# Patient Record
Sex: Female | Born: 1970 | Race: White | Hispanic: No | Marital: Married | Smoking: Never smoker
Health system: Southern US, Community
[De-identification: ages and names within clinical notes are randomized; demographics above are authoritative.]

## PROBLEM LIST (undated history)

## (undated) DIAGNOSIS — F419 Anxiety disorder, unspecified: Secondary | ICD-10-CM

---

## 2005-07-01 ENCOUNTER — Ambulatory Visit (HOSPITAL_COMMUNITY): Admission: RE | Admit: 2005-07-01 | Discharge: 2005-07-01 | Payer: Self-pay | Admitting: Obstetrics and Gynecology

## 2005-07-08 ENCOUNTER — Inpatient Hospital Stay (HOSPITAL_COMMUNITY): Admission: RE | Admit: 2005-07-08 | Discharge: 2005-07-09 | Payer: Self-pay | Admitting: Obstetrics and Gynecology

## 2005-07-08 ENCOUNTER — Encounter (INDEPENDENT_AMBULATORY_CARE_PROVIDER_SITE_OTHER): Payer: Self-pay | Admitting: *Deleted

## 2006-06-11 ENCOUNTER — Other Ambulatory Visit: Admission: RE | Admit: 2006-06-11 | Discharge: 2006-06-11 | Payer: Self-pay | Admitting: Gynecology

## 2007-07-31 ENCOUNTER — Other Ambulatory Visit: Admission: RE | Admit: 2007-07-31 | Discharge: 2007-07-31 | Payer: Self-pay | Admitting: *Deleted

## 2008-05-15 ENCOUNTER — Inpatient Hospital Stay (HOSPITAL_COMMUNITY): Admission: AD | Admit: 2008-05-15 | Discharge: 2008-05-28 | Payer: Self-pay | Admitting: Obstetrics

## 2008-05-25 ENCOUNTER — Encounter (INDEPENDENT_AMBULATORY_CARE_PROVIDER_SITE_OTHER): Payer: Self-pay | Admitting: Obstetrics

## 2008-05-30 ENCOUNTER — Encounter: Admission: RE | Admit: 2008-05-30 | Discharge: 2008-06-29 | Payer: Self-pay | Admitting: Obstetrics

## 2008-06-30 ENCOUNTER — Encounter: Admission: RE | Admit: 2008-06-30 | Discharge: 2008-07-26 | Payer: Self-pay | Admitting: Obstetrics

## 2009-11-09 IMAGING — US US OB COMP +14 WK
1 series · 14 of 28 positions shown · non-contrast
Comparison: none

OBSTETRICAL ULTRASOUND:
 This ultrasound exam was performed in the [HOSPITAL] Ultrasound Department.  The OB US report was generated in the AS system, and faxed to the ordering physician.  This report is also available in [REDACTED] PACS.

[Series 1: us ob comp +14 wk · 14 of 79 slices shown]
[im 3/79]
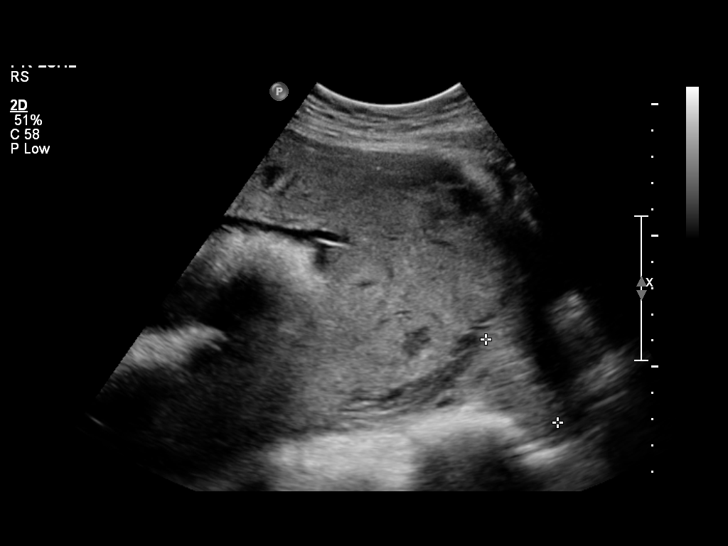
[im 9/79]
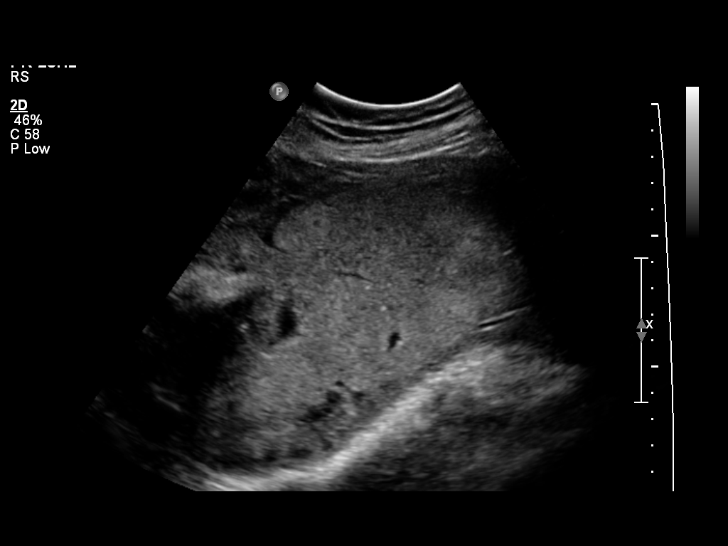
[im 15/79]
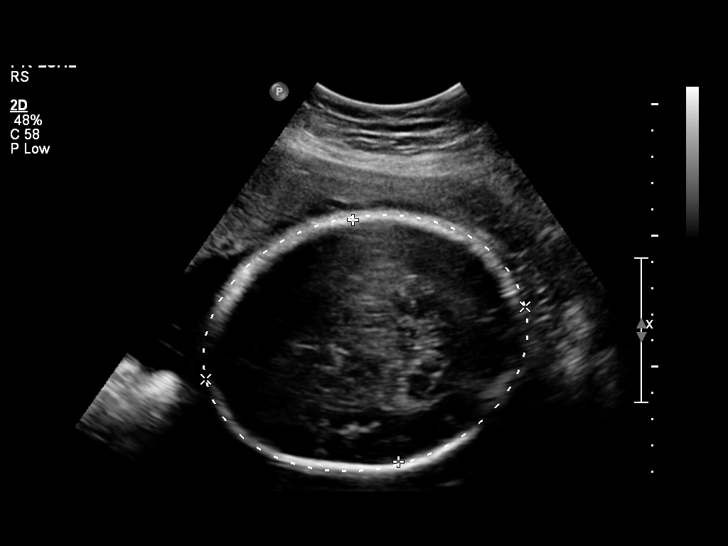
[im 21/79]
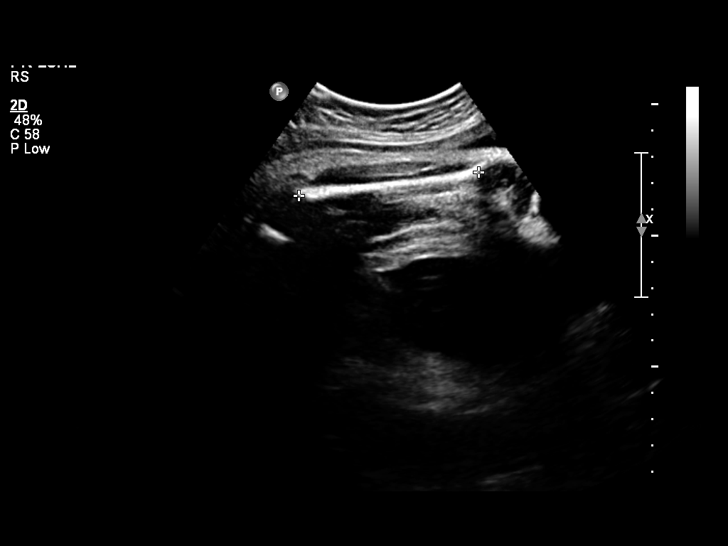
[im 27/79]
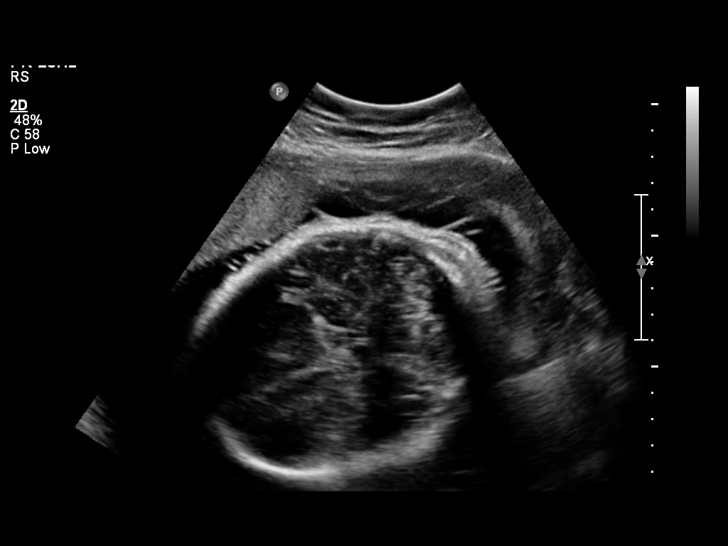
[im 32/79]
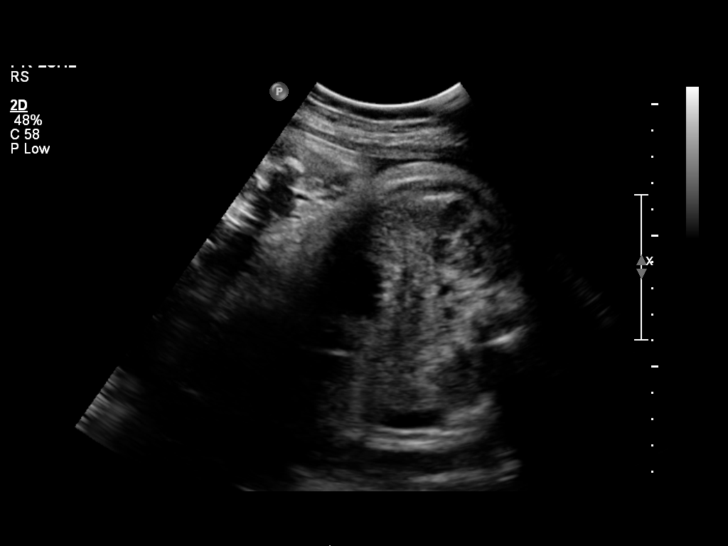
[im 38/79]
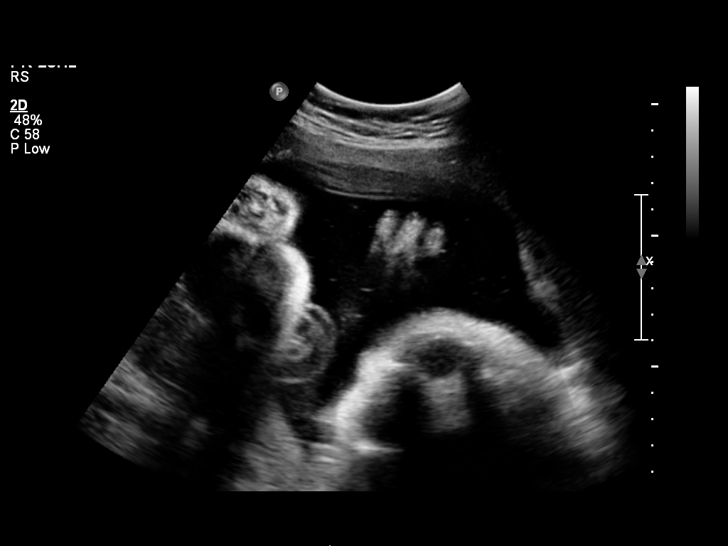
[im 44/79]
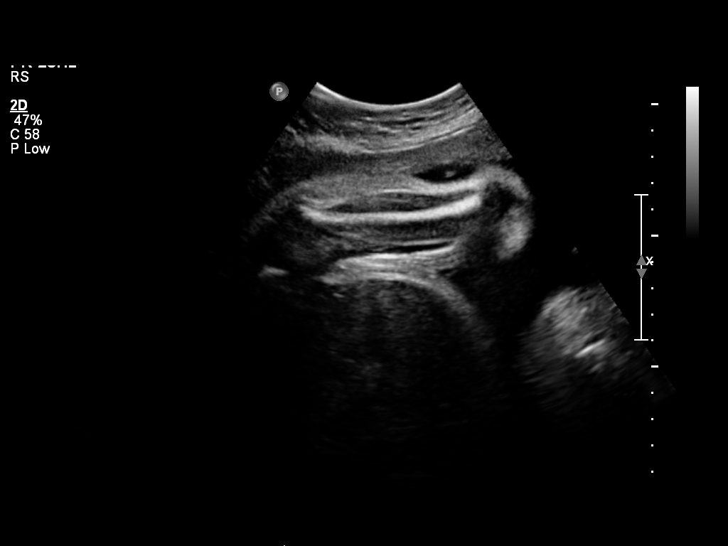
[im 50/79]
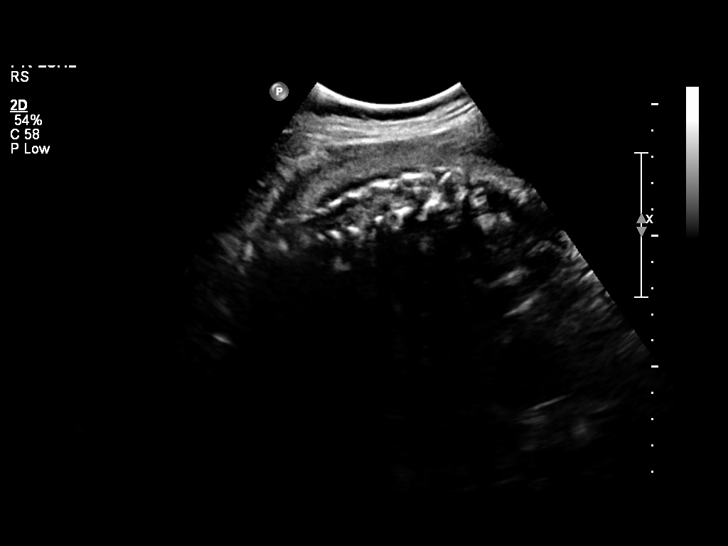
[im 55/79]
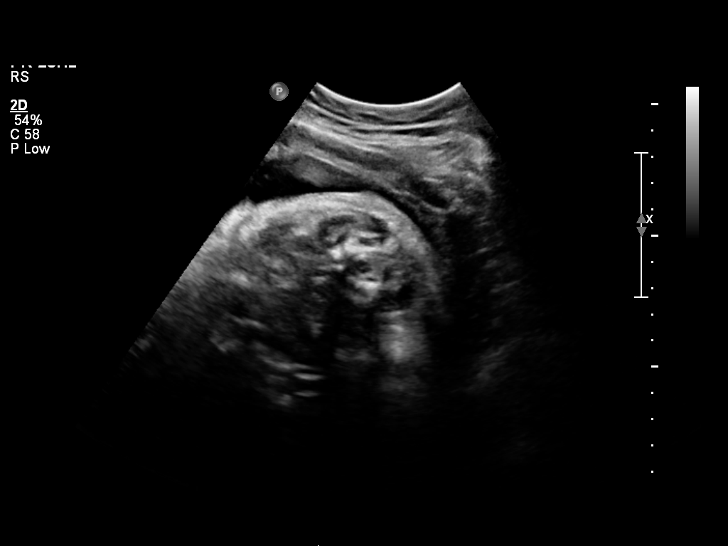
[im 61/79]
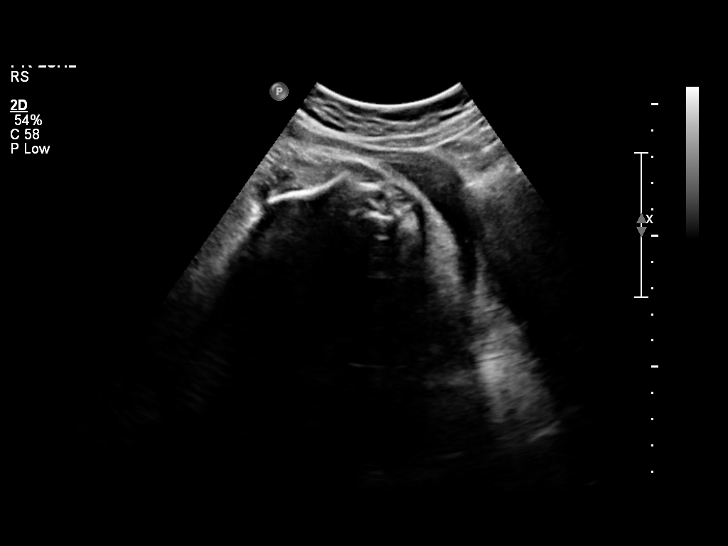
[im 67/79]
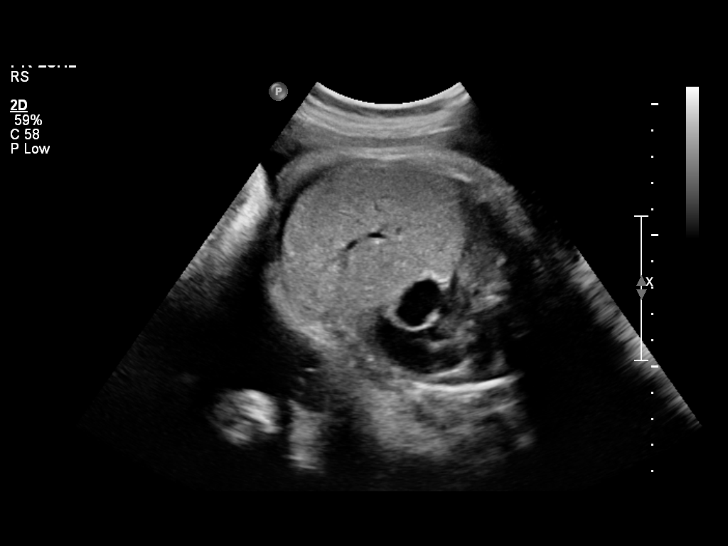
[im 73/79]
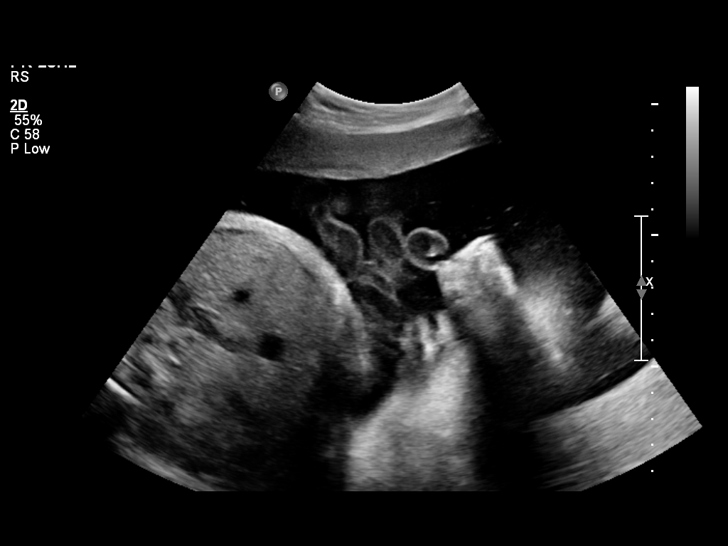
[im 79/79]
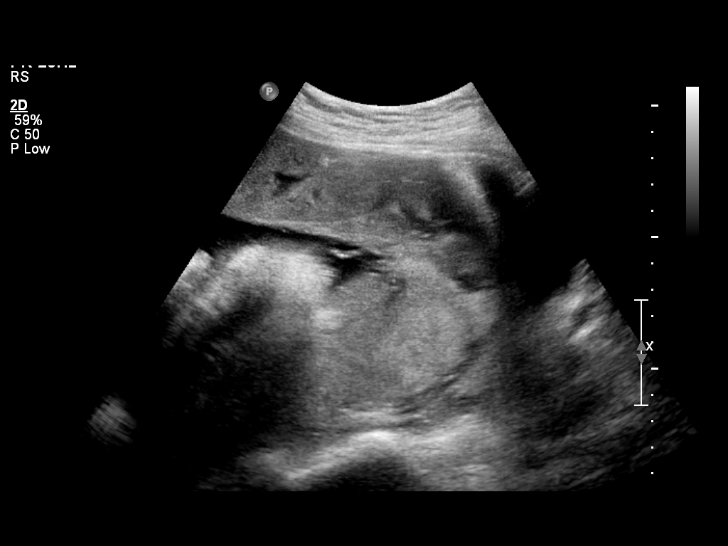

[14 of 28 positions shown; findings below may reference images not displayed]

IMPRESSION: See AS Obstetric US report.

## 2010-12-02 ENCOUNTER — Encounter: Payer: Self-pay | Admitting: Obstetrics and Gynecology

## 2011-03-26 NOTE — Op Note (Signed)
Gloria Velez, Gloria Velez           ACCOUNT NO.:  [id number]   MEDICAL RECORD NO.:  [id number]          PATIENT TYPE:  INP   LOCATION:  NA                            FACILITY:  [facility]   PHYSICIAN:  Lendon Colonel, MD   DATE OF BIRTH:  Sep 28, 1971   DATE OF PROCEDURE:  05/25/2008  DATE OF DISCHARGE:                               OPERATIVE REPORT   PREOPERATIVE DIAGNOSIS:  Complete placenta previa, [redacted] weeks gestation,  undesired fertility.   POSTOPERATIVE DIAGNOSIS:  Complete placenta previa, [redacted] weeks gestation,  undesired fertility.   PROCEDURE:  Primary low-transverse cesarean section and bilateral tubal  ligation via Pomeroy, vacuum assist with C-section.   SURGEON:  Alphonsus Sias. Ernestina Penna, MD   ASSISTANT:  Maxie Better, MD   ANESTHESIA:  Spinal.   FINDINGS:  Female infant in the direct occiput anterior position, weight 6  pounds 10 ounces, Apgars 5 and 7.   SPECIMENS:  Placenta, bilateral tubal segments.   ANTIBIOTICS:  2 g of Ancef.   ESTIMATED BLOOD LOSS:  1300 mL.   COMPLICATIONS:  None.   PROCEDURE:  After informed consent was obtained, the patient was taken  to the operating room where general anesthesia was administered  initially without difficulty.  She was prepped and draped in normal  sterile fashion in dorsal supine position with a leftward tilt.  A Foley  catheter was inserted sterilely into the bladder.  A Pfannenstiel skin  incision was made 2 cm above the pubic symphysis in the midline, carried  through to the underlying layer of fascia with the Bovie cautery.  The  fascia was nicked in the midline with the Bovie cautery and extended  with the Mayo scissors.  The inferior aspect of the fascial incision was  grasped with Kocher clamps, elevated up, and the underlying rectus  muscle dissected off sharply.  Attention was turned to the superior  aspect of fascial incision, which in a similar fashion was grasped with  Kocher clamp, to elevate up and the  underlying rectus muscle dissected  off sharply.  The rectus muscles were separated bluntly.  Pyrimadallis  muscles were divided sharply.  The peritoneum was identified and entered  bluntly.  Peritoneal incision was extended superiorly inferiorly with  good visualization of the bladder.  The uterus was inspected and  vesicouterine peritoneum was identified, grasped and entered with the  Metzenbaum scissor and the incision was extended laterally, and the  bladder flap was created digitally.  The uterus was carefully inspected  and the likely location of placenta was noted. Decision was made to  proceed with a low-transverse cesarean section.  An incision was made  with the scalpel and extended bluntly.  Upon incision, the placenta was  encountered, brisk bleeding was noted.  Through the placenta, the  amniotic membranes were ruptured and the infant's occiput was noted to  be high in the abdomen, despite pressure from above.  The infant's  occiput was brought to the incision, however, delivery was not easily  accomplished, a vacuum was applied to the infant's occiput and delivery  was accomplished, nuchal x1 was noted, the cord  was clamped and cut, and  infant was handed off to the awaiting pediatricians.  The placenta was  expressed.  The uterus was exteriorized, cleared of all clots and debris-  no attached placenta was seen. Attention was given to the lower uterine  segment.  No remaining placental tissue was noted and bleeding was as  expected.  The incision was closed with 0-Vicryl in a running lock  fashion.  The bladder flap was then sharply dissected with the  Metzenbaum's and a second layer of 0-Vicryl was used in imbricating  fashion to obtain excellent hemostasis.  Additional Pitocin was called  for given some degree of uterine atony as was Methergine 0.2 mg IM.  Bilateral tubal segments were identified; tubes appeared normal, ovaries  appeared normal and a 2-cm knuckle of each  tube was grasped with Tanja Port  and tied off with a free tie of plain gut x2.  Tubal segments were  excised with Metzenbaum scissors and sent to pathology.  The uterine  incision was reinspected and found to be hemostatic.  The uterus was  returned to the abdomen.  The uterine incision was again reinspected and  found to be hemostatic.  Good fundal tone was noted and also some  bleeding was noted from the uterus or the incision.  The cut muscle  edges and undersurface of the fascia were inspected and found to be  hemostatic.  The perineum was closed with 2-0 Vicryl in a running  fashion.  The fascia was closed with 0-Vicryl in a running fashion into  half.  The subcutaneous tissue was closed with a plain gut suture and  the skin was closed with staples.  The patient tolerated the procedure  well.  Sponge, lap, and needle counts were correct x3.  The patient was  taken to recovery room in stable condition.      Lendon Colonel, MD  Electronically Signed     KAF/MEDQ  D:  05/25/2008  T:  05/26/2008  Job:  [id number]

## 2011-03-26 NOTE — Discharge Summary (Signed)
NAMEVERONIA, LAPRISE           ACCOUNT NO.:  [id number]   MEDICAL RECORD NO.:  [id number]          PATIENT TYPE:  INP   LOCATION:  9109                          FACILITY:  [facility]   PHYSICIAN:  Lendon Colonel, MD   DATE OF BIRTH:  October 23, 1971   DATE OF ADMISSION:  05/15/2008  DATE OF DISCHARGE:  05/18/2008                               DISCHARGE SUMMARY   CHIEF COMPLAINT:  Vaginal bleeding.   HISTORY OF PRESENT ILLNESS:  This is a 40 year old G3, P1-0-1-1 at 35  weeks and 0 days with known complete placenta previa who presented with  bright red vaginal bleeding beginning 45 minutes prior to presentation.  The patient had arrival by EMS.  She was noted to have soaked 1 pad with  blood staining out to her underwear.  She had no contractions or  abdominal pain, noted good fetal movement, had no history of trauma and  noted that she had been well hydrated over the past several days.  She  has no toxic habits.   Her past OB history is significant for a 40-week spontaneous vaginal  delivery followed by a 19-week preterm PROM and loss secondary to chorio  and this third pregnancy being an IVF pregnancy.   Her medical history is significant for an abnormal HSG and  hypercholesterolemia.   Her surgical history is significant only for a prior diagnostic  laparoscopy.   On admission, her prenatal labs were reviewed, which were all normal,  her most recent ultrasound having been on May 05, 2008, putting the  baby at the 98th percentile with a normal AFI, and she was afebrile with  stable vital signs.  Her genitourinary exam showed via sterile speculum  exam a closed cervix with a plum-sized clot and thin bloody fluid that  was fern negative.  An ultrasound showed vertex with a complete previa.  AFI of 9.7.  Tocometer showed some irritability and the fetal heart rate  in the 140s with good accelerations, no decelerations, and 10-beat  variability.   Her labs are significant for  hemoglobin 11.2, platelets of 209, and  normal coags.   Hospital Course  This is a 40 year old G3, P1 who was admitted at 34 weeks and 5 days  with a known placenta previa and moderate amount of vaginal bleeding,  but otherwise stable maternal and fetal status.  The patient was  admitted to Antepartum Unit for continuous monitoring, further  evaluation plan was made to proceed with  C-section with any  uncontrolled bleeding or signs of fetal distress.  The NICU was made  aware of the patient's admission and the mode of delivery was for  cesarean section.  On hospital day #2, the patient noted some poor sleep  overnight, but reported no further contractions, no further vaginal  bleeding, had good fetal movement, and no leakage of fluid.  Her vitals  were stable.  She was having some irritability on the tocometer.  The  baby's testing was reassuring with some rare quick decelerations.  Her  hemoglobin went from 11.1 to 9.8 after hydration, her platelets from 186  to 170 and her  coag panel remained normal with normal fibrinogen and a  normal INR.  The plan was to continue close observation as an inpatient,  keep type and screen active every 3 days, but at that point given her  stability, her IV was KVO.  The plan was for growth scans every 2 weeks,  t.i.d. monitoring of the baby with plan for delivery at around 36 weeks  or sooner with any abnormal bleeding.  For prophylaxis she was kept on  Colace, Nexium, and SCDs while she was in bed.  By hospital day #3 given  the reassuring outlook, monitoring was moved from continuous monitoring  to t.i.d. and p.r.n.  On hospital day #3, it should be noted that given  the tocometer continued to show contractions, the patient was started on  Procardia 10 mg p.o. q.4 h., this was continued through her stay.  The  patient remained stable, did use the sleeping pill at night, and  activity level was slowly increased to allow bathroom privileges and  then  shower and wheelchair privileges.  Throughout her stay, discussion  was had regarding the need for amniocentesis for fetal lung maturity and  discussion of timing of delivery.  After careful consideration of all  options, the decision was made to plan for C-section at 36 weeks without  an amniocentesis.  Due to OR and physician timing, her C-section  actually occurred at 36 weeks and 1 day.  Preoperative discussion of  postpartum contraception was had and the patient decided that she did  want a tubal ligation.  On 05/25/2008 at 11:30 the patient had an  uncomplicated low-transverse cesarean section.  Vacuum assistance was  needed due to the small uterine incision.  Increased blood loss was  noted of 1300 mL and a bilateral tubal ligation via the Pomeroy was  done.  Apgars were 5 and 7 and baby's weight was 610.  Postoperatively,  the patient did well.  Her hemoglobin was 9.5.  By postop day #3, she  was eating, ambulating, voiding, and tolerating regular p.o.  She had  adequate pain control and was discharged to home.  The baby was in the  NICU with an NG tube and CPAP and went home shortly thereafter.   DISCHARGE DIAGNOSES:  1. Complete placenta previa, 36-week.  2. Primary low transverse cesarean section.  3. Undesired fertility, status post bilateral tubal ligation.   DISCHARGE CONDITION:  Stable.   DISCHARGE DISPOSITION:  To home.   DISCHARGE MEDICATIONS:  Colace, Percocet, and Motrin.   DISCHARGE FINDINGS:  Female infant 6 pounds and 10 ounces, Apgars 5 and 7,  1300 mL EBL and a primary low transverse cesarean section at 36 weeks.      Lendon Colonel, MD  Electronically Signed     KAF/MEDQ  D:  06/21/2008  T:  06/22/2008  Job:  [id number]

## 2011-08-08 LAB — CBC
HCT: 27.9 — ABNORMAL LOW
HCT: 32.5 — ABNORMAL LOW
HCT: 32.6 — ABNORMAL LOW
Hemoglobin: 10 — ABNORMAL LOW
Hemoglobin: 9.8 — ABNORMAL LOW
MCHC: 34
MCHC: 34.2
MCHC: 35
MCV: 92.8
MCV: 93.3
Platelets: 170
Platelets: 181
Platelets: 186
RBC: 3.01 — ABNORMAL LOW
RBC: 3.11 — ABNORMAL LOW
RBC: 3.48 — ABNORMAL LOW
RDW: 14.1
RDW: 14.7
WBC: 10.4
WBC: 8.7
WBC: 9.8

## 2011-08-08 LAB — TYPE AND SCREEN: Antibody Screen: NEGATIVE

## 2011-08-08 LAB — CROSSMATCH

## 2011-08-08 LAB — APTT: aPTT: 26

## 2011-08-08 LAB — PROTIME-INR: INR: 1

## 2011-08-08 LAB — FIBRINOGEN
Fibrinogen: 460
Fibrinogen: 499 — ABNORMAL HIGH

## 2011-08-08 LAB — RPR: RPR Ser Ql: NONREACTIVE

## 2011-08-09 LAB — CROSSMATCH: ABO/RH(D): O POS

## 2011-08-09 LAB — CBC
HCT: 27.9 — ABNORMAL LOW
Hemoglobin: 9.5 — ABNORMAL LOW
MCHC: 34.4
MCHC: 34.5
Platelets: 194
RBC: 2.95 — ABNORMAL LOW
RBC: 3.51 — ABNORMAL LOW
RDW: 14.3
WBC: 12.6 — ABNORMAL HIGH
WBC: 14.9 — ABNORMAL HIGH

## 2011-08-09 LAB — CCBB MATERNAL DONOR DRAW

## 2013-12-06 ENCOUNTER — Other Ambulatory Visit: Payer: Self-pay | Admitting: Gynecology

## 2014-06-14 ENCOUNTER — Other Ambulatory Visit: Payer: Self-pay | Admitting: Gynecology

## 2014-06-15 LAB — CYTOLOGY - PAP

## 2015-06-07 ENCOUNTER — Other Ambulatory Visit: Payer: Self-pay | Admitting: Gynecology

## 2015-06-08 LAB — CYTOLOGY - PAP

## 2019-09-30 ENCOUNTER — Other Ambulatory Visit: Payer: Self-pay

## 2019-09-30 DIAGNOSIS — Z20822 Contact with and (suspected) exposure to covid-19: Secondary | ICD-10-CM

## 2019-10-03 LAB — NOVEL CORONAVIRUS, NAA: SARS-CoV-2, NAA: NOT DETECTED

## 2020-01-16 ENCOUNTER — Ambulatory Visit: Payer: Self-pay | Attending: Internal Medicine

## 2020-01-16 DIAGNOSIS — Z23 Encounter for immunization: Secondary | ICD-10-CM | POA: Insufficient documentation

## 2020-01-16 NOTE — Progress Notes (Signed)
   Covid-19 Vaccination Clinic  Name:  Gloria Velez    MRN: [id number] DOB: 1971/01/15  01/16/2020  [name] was observed post Covid-19 immunization for 15 minutes without incident. She was provided with Vaccine Information Sheet and instruction to access the V-Safe system.   [name] was instructed to call 911 with any severe reactions post vaccine: Marland Kitchen Difficulty breathing  . Swelling of face and throat  . A fast heartbeat  . A bad rash all over body  . Dizziness and weakness   Immunizations Administered    Name Date Dose VIS Date Route   Pfizer COVID-19 Vaccine 01/16/2020  2:20 PM 0.3 mL 10/22/2019 Intramuscular   Manufacturer: [employer], [employer]   Lot: [id number]   NDC: [address]-5

## 2020-02-06 ENCOUNTER — Ambulatory Visit: Payer: Self-pay | Attending: Internal Medicine

## 2020-02-06 DIAGNOSIS — Z23 Encounter for immunization: Secondary | ICD-10-CM

## 2020-02-06 NOTE — Progress Notes (Signed)
   Covid-19 Vaccination Clinic  Name:  LEONARD HENDLER    MRN: [id number] DOB: [birth year]  02/06/2020  Ms. Bambach was observed post Covid-19 immunization for 15 minutes without incident. She was provided with Vaccine Information Sheet and instruction to access the V-Safe system.   Ms. Potocki was instructed to call 911 with any severe reactions post vaccine: Marland Kitchen Difficulty breathing  . Swelling of face and throat  . A fast heartbeat  . A bad rash all over body  . Dizziness and weakness   Immunizations Administered    Name Date Dose VIS Date Route   Pfizer COVID-19 Vaccine 02/06/2020 12:48 PM 0.3 mL 10/22/2019 Intramuscular   Manufacturer: [employer], [employer]   Lot: [id number]   NDC: [address]-0

## 2021-04-24 ENCOUNTER — Ambulatory Visit
Admission: EM | Admit: 2021-04-24 | Discharge: 2021-04-24 | Disposition: A | Discharge status: Home / Self Care | Payer: BC Managed Care – PPO | Attending: Family Medicine | Admitting: Family Medicine

## 2021-04-24 ENCOUNTER — Encounter: Payer: Self-pay | Admitting: Emergency Medicine

## 2021-04-24 DIAGNOSIS — W57XXXA Bitten or stung by nonvenomous insect and other nonvenomous arthropods, initial encounter: Secondary | ICD-10-CM

## 2021-04-24 DIAGNOSIS — M5442 Lumbago with sciatica, left side: Secondary | ICD-10-CM | POA: Diagnosis not present

## 2021-04-24 HISTORY — DX: Anxiety disorder, unspecified: F41.9

## 2021-04-24 MED ORDER — DICLOFENAC SODIUM 75 MG PO TBEC: 75.0000 mg | DELAYED_RELEASE_TABLET | Freq: Two times a day (BID) | ORAL | 0 refills | Status: DC

## 2021-04-24 MED ORDER — PREDNISONE 20 MG PO TABS: 40.0000 mg | ORAL_TABLET | Freq: Every day | ORAL | 0 refills | Status: DC

## 2021-04-24 NOTE — ED Triage Notes (Addendum)
Lower back/ side pain to LT side that goes down her LT leg since this morning.  Moved a lot of boxes recently.  Took 600 mg ibuprofen at 1030 with minimal relief.  Also has tick bite to RT side x 9 days that has continued to get redder.

## 2021-04-24 NOTE — ED Provider Notes (Signed)
[facility]   [id number] 04/24/21 Arrival Time: 1122  ASSESSMENT & PLAN:  1. Acute left-sided low back pain with left-sided sciatica   2. Tick bite of abdominal wall, initial encounter    No signs of bacterial skin infection around tick bite; no signs of retained products.  Able to ambulate here and hemodynamically stable regarding back pain. No indication for imaging of back at this time given no trauma and normal neurological exam. Discussed.  Begin trial of: Meds ordered this encounter  Medications   diclofenac (VOLTAREN) 75 MG EC tablet    Sig: Take 1 tablet (75 mg total) by mouth 2 (two) times daily.    Dispense:  14 tablet    Refill:  0   predniSONE (DELTASONE) 20 MG tablet    Sig: Take 2 tablets (40 mg total) by mouth daily.    Dispense:  10 tablet    Refill:  0    Medication sedation precautions given. Encourage ROM/movement as tolerated.  Recommend:  Follow-up Information     [facility].   Specialty: Urgent Care Why: If worsening or failing to improve as anticipated. Contact information: [address], [address] [place] [address] [phone]                Reviewed expectations re: course of current medical issues. Questions answered. Outlined signs and symptoms indicating need for more acute intervention. Patient verbalized understanding. After Visit Summary given.   SUBJECTIVE: History from: patient.  Gloria Velez is a 50 y.o. female who presents with complaint of persistent left sided lower back pain. Onset abrupt. First noted today. Injury/trama: questions related to lifting boxes recently. History of back problems requiring medical care: rare. Pain described as aching and aching with occasional sharp pain radiating down left leg. Aggravating factors: certain movements. Alleviating factors: have not been identified. Progressive LE weakness or saddle anesthesia: none. Extremity  sensation changes or weakness: none. Ambulatory without difficulty. Normal bowel/bladder habits: yes; without urinary retention. Normal PO intake without n/v. No associated abdominal pain/n/v. Self treatment: has  ibuprofen with little relief . Reports no chronic steroid use, fevers, IV drug use, or recent back surgeries or procedures.  Also reports tick bite; R lower side of abdomen; 9 day ago; still red; "deeper shade of red today". Afebrile. No drainage or bleeding.   OBJECTIVE:  Vitals:   04/24/21 1246  BP: 125/79  Pulse: 78  Resp: 17  Temp: 98 F (36.7 C)  TempSrc: Oral  SpO2: 96%    General appearance: alert; no distress HEENT: [place]; AT Neck: supple with FROM; without midline tenderness CV: regular Lungs: unlabored respirations; speaks full sentences without difficulty Abdomen: soft, non-tender; non-distended Back: poorly localized tenderness to palpation over L lumbar paraspinal musculature ; FROM at waist; bruising: none; without midline tenderness Extremities: without edema; symmetrical without gross deformities; normal ROM of LLE; does reports L lower back pain with L leg extension Skin: warm and dry Neurologic: normal gait; normal sensation and strength of bilateral LE Psychological: alert and cooperative; normal mood and affect  No Known Allergies  Past Medical History:  Diagnosis Date   Anxiety    Social History   Socioeconomic History   Marital status: Married    Spouse name: Not on file   Number of children: Not on file   Years of education: Not on file   Highest education level: Not on file  Occupational History   Not on file  Tobacco  Use   Smoking status: Never   Smokeless tobacco: Never  Substance and Sexual Activity   Alcohol use: Never   Drug use: Never   Sexual activity: Not on file  Other Topics Concern   Not on file  Social History Narrative   Not on file   Social Determinants of Health   Financial Resource Strain: Not on file  Food  Insecurity: Not on file  Transportation Needs: Not on file  Physical Activity: Not on file  Stress: Not on file  Social Connections: Not on file  Intimate Partner Violence: Not on file   No family history on file. Past Surgical History:  Procedure Laterality Date   CESAREAN SECTION        Mardella Layman, MD 04/24/21 1359

## 2023-07-07 ENCOUNTER — Encounter: Payer: Self-pay | Admitting: *Deleted

## 2023-08-01 ENCOUNTER — Telehealth: Payer: Self-pay | Admitting: Internal Medicine

## 2023-08-01 DIAGNOSIS — Z1211 Encounter for screening for malignant neoplasm of colon: Secondary | ICD-10-CM

## 2023-08-01 NOTE — Telephone Encounter (Signed)
Questionnaire in review

## 2023-08-12 NOTE — Addendum Note (Signed)
Addended by: Armstead Peaks on: 08/12/2023 09:15 AM   Modules accepted: Orders

## 2023-08-12 NOTE — Telephone Encounter (Signed)
  Procedure: Colonoscopy   Height: 5'7 Weight: 180lbs       Have you had a colonoscopy before?  no  Do you have family history of colon cancer?  Paternal grandmother  Do you have a family history of polyps? no  Previous colonoscopy with polyps removed? no  Do you have a history colorectal cancer?   no  Are you diabetic?  no  Do you have a prosthetic or mechanical heart valve? no  Do you have a pacemaker/defibrillator?   no  Have you had endocarditis/atrial fibrillation?  no  Do you use supplemental oxygen/CPAP?  no  Have you had joint replacement within the last 12 months?  no  Do you tend to be constipated or have to use laxatives?  no   Do you have history of alcohol use? If yes, how much and how often.  no  Do you have history or are you using drugs? If yes, what do are you  using?  no  Have you ever had a stroke/heart attack?  no  Have you ever had a heart or other vascular stent placed,?no  Do you take weight loss medication? no  female patients,: have you had a hysterectomy? no                              are you post menopausal?  no                              do you still have your menstrual cycle? no    Date of last menstrual period?   Do you take any blood-thinning medications such as: (Plavix, aspirin, Coumadin, Aggrenox, Brilinta, Xarelto, Eliquis, Pradaxa, Savaysa or Effient)? no  If yes we need the name, milligram, dosage and who is prescribing doctor:               Current Outpatient Medications  Medication Sig Dispense Refill   busPIRone (BUSPAR) 7.5 MG tablet Take 7.5 mg by mouth 2 (two) times daily.     Cholecalciferol (D3-1000 PO) Take by mouth daily.     Turmeric (QC TUMERIC COMPLEX PO) Take by mouth.     No current facility-administered medications for this visit.    No Known Allergies

## 2023-08-15 NOTE — Telephone Encounter (Signed)
Ok to schedule.  ASA 2. If needed, pregnancy test per protocol

## 2023-08-18 MED ORDER — NA SULFATE-K SULFATE-MG SULF 17.5-3.13-1.6 GM/177ML PO SOLN: ORAL | 0 refills | Status: AC

## 2023-08-18 NOTE — Addendum Note (Signed)
Addended by: Armstead Peaks on: 08/18/2023 10:31 AM   Modules accepted: Orders

## 2023-08-18 NOTE — Telephone Encounter (Signed)
Spoke with pt. She has been scheduled for 10/21. Aware will send instructions. Rx for prep sent to pharmacy. Still gets period at times due to IUD. Aware needs preg test prior. Orders entered for [facility].

## 2023-08-18 NOTE — Telephone Encounter (Signed)
LMOVM to call back 

## 2023-08-19 ENCOUNTER — Encounter: Payer: Self-pay | Admitting: *Deleted

## 2023-08-19 NOTE — Telephone Encounter (Signed)
Referral completed, [facility] apt letter sent to PCP

## 2023-08-28 ENCOUNTER — Other Ambulatory Visit (HOSPITAL_COMMUNITY)
Admission: RE | Admit: 2023-08-28 | Discharge: 2023-08-28 | Disposition: A | Discharge status: Home / Self Care | Payer: BC Managed Care – PPO | Source: Ambulatory Visit | Attending: Internal Medicine | Admitting: Internal Medicine

## 2023-08-28 DIAGNOSIS — Z1211 Encounter for screening for malignant neoplasm of colon: Secondary | ICD-10-CM | POA: Diagnosis present

## 2023-08-28 LAB — PREGNANCY, URINE: Preg Test, Ur: NEGATIVE

## 2023-09-01 ENCOUNTER — Other Ambulatory Visit: Payer: Self-pay

## 2023-09-01 ENCOUNTER — Ambulatory Visit (HOSPITAL_COMMUNITY): Payer: BC Managed Care – PPO | Admitting: Anesthesiology

## 2023-09-01 ENCOUNTER — Encounter (HOSPITAL_COMMUNITY)
Admission: RE | Disposition: A | Discharge status: Home / Self Care | Payer: Self-pay | Source: Home / Self Care | Attending: Internal Medicine

## 2023-09-01 ENCOUNTER — Encounter (HOSPITAL_COMMUNITY): Payer: Self-pay | Admitting: Internal Medicine

## 2023-09-01 ENCOUNTER — Ambulatory Visit (HOSPITAL_COMMUNITY)
Admission: RE | Admit: 2023-09-01 | Discharge: 2023-09-01 | Disposition: A | Discharge status: Home / Self Care | Payer: BC Managed Care – PPO | Attending: Internal Medicine | Admitting: Internal Medicine

## 2023-09-01 DIAGNOSIS — Z1211 Encounter for screening for malignant neoplasm of colon: Secondary | ICD-10-CM | POA: Diagnosis present

## 2023-09-01 DIAGNOSIS — F419 Anxiety disorder, unspecified: Secondary | ICD-10-CM | POA: Insufficient documentation

## 2023-09-01 HISTORY — PX: COLONOSCOPY WITH PROPOFOL: SHX5780

## 2023-09-01 SURGERY — COLONOSCOPY WITH PROPOFOL
Anesthesia: General

## 2023-09-01 MED ORDER — STERILE WATER FOR IRRIGATION IR SOLN
Status: DC | PRN
  Administered 2023-09-01: 60 mL

## 2023-09-01 MED ORDER — PROPOFOL 500 MG/50ML IV EMUL
INTRAVENOUS | Status: DC | PRN
  Administered 2023-09-01: 200 ug/kg/min via INTRAVENOUS

## 2023-09-01 MED ORDER — PROPOFOL 10 MG/ML IV BOLUS
INTRAVENOUS | Status: DC | PRN
  Administered 2023-09-01: 100 mg via INTRAVENOUS

## 2023-09-01 MED ORDER — LACTATED RINGERS IV SOLN: INTRAVENOUS | Status: DC | PRN

## 2023-09-01 NOTE — Anesthesia Postprocedure Evaluation (Signed)
Anesthesia Post Note  Patient: Gloria Velez  Procedure(s) Performed: COLONOSCOPY WITH PROPOFOL  Patient location during evaluation: PACU Anesthesia Type: General Level of consciousness: awake and alert Pain management: pain level controlled Vital Signs Assessment: post-procedure vital signs reviewed and stable Respiratory status: spontaneous breathing, nonlabored ventilation, respiratory function stable and patient connected to nasal cannula oxygen Cardiovascular status: blood pressure returned to baseline and stable Postop Assessment: no apparent nausea or vomiting Anesthetic complications: no   There were no known notable events for this encounter.   Last Vitals:  Vitals:   09/01/23 1118 09/01/23 1240  BP: 136/74 (!) 101/58  Pulse: 84 82  Resp: 17 17  Temp: 36.9 C 36.6 C  SpO2: 97% 98%    Last Pain:  Vitals:   09/01/23 1240  TempSrc: Axillary  PainSc: 0-No pain                 Ernest Popowski L Cloma Rahrig

## 2023-09-01 NOTE — Op Note (Signed)
[facility] Patient Name: Gloria Velez Procedure Date: 09/01/2023 12:12 PM MRN: [id number] Date of Birth: 05-31-1971 Attending MD: Gennette Pac , MD, [id number] CSN: [id number] Age: 52 Admit Type: Outpatient Procedure:                Colonoscopy Indications:              Screening for colorectal malignant neoplasm Providers:                Gennette Pac, MD, Edrick Kins, RN,                            Zena Amos Referring MD:              Medicines:                Propofol per Anesthesia Complications:            No immediate complications. Estimated Blood Loss:     Estimated blood loss: none. Procedure:                Pre-Anesthesia Assessment:                           - Prior to the procedure, a History and Physical                            was performed, and patient medications and                            allergies were reviewed. The patient's tolerance of                            previous anesthesia was also reviewed. The risks                            and benefits of the procedure and the sedation                            options and risks were discussed with the patient.                            All questions were answered, and informed consent                            was obtained. Prior Anticoagulants: The patient has                            taken no anticoagulant or antiplatelet agents. ASA                            Grade Assessment: II - A patient with mild systemic                            disease. After reviewing the risks and benefits,  the patient was deemed in satisfactory condition to                            undergo the procedure.                           After obtaining informed consent, the colonoscope                            was passed under direct vision. Throughout the                            procedure, the patient's blood pressure, pulse, and                             oxygen saturations were monitored continuously. The                            [id number]) scope was introduced through the                            anus and advanced to the the cecum, identified by                            appendiceal orifice and ileocecal valve. The                            colonoscopy was performed without difficulty. The                            patient tolerated the procedure well. The quality                            of the bowel preparation was adequate. The                            colonoscopy was performed without difficulty. The                            patient tolerated the procedure well. Scope In: 12:20:37 PM Scope Out: 12:36:59 PM Scope Withdrawal Time: 0 hours 8 minutes 53 seconds  Total Procedure Duration: 0 hours 16 minutes 22 seconds  Findings:      The perianal and digital rectal examinations were normal.      The colon (entire examined portion) appeared normal.      The retroflexed view of the distal rectum and anal verge was normal and       showed no anal or rectal abnormalities. Impression:               - The entire examined colon is normal.                           - The distal rectum and anal verge are normal on  retroflexion view.                           - No specimens collected. Moderate Sedation:      Moderate (conscious) sedation was personally administered by an       anesthesia professional. The following parameters were monitored: oxygen       saturation, heart rate, blood pressure, respiratory rate, EKG, adequacy       of pulmonary ventilation, and response to care. Recommendation:           - Patient has a contact number available for                            emergencies. The signs and symptoms of potential                            delayed complications were discussed with the                            patient. Return to normal activities tomorrow.                             Written discharge instructions were provided to the                            patient.                           - Advance diet as tolerated.                           - Continue present medications.                           - Repeat colonoscopy in 10 years for screening                            purposes.                           - Return to GI office PRN. Procedure Code(s):        --- Professional ---                           [id number], Colonoscopy, flexible; diagnostic, including                            collection of specimen(s) by brushing or washing,                            when performed (separate procedure) Diagnosis Code(s):        --- Professional ---                           Z12.11, Encounter for screening for malignant  neoplasm of colon CPT copyright 2022 [employer]. All rights reserved. The codes documented in this report are preliminary and upon coder review may  be revised to meet current compliance requirements. Gerrit Friends. Olar Santini, MD Gennette Pac, MD 09/01/2023 12:42:49 PM This report has been signed electronically. Number of Addenda: 0

## 2023-09-01 NOTE — H&P (Signed)
@  UJWJ@   Primary Care Physician:  Zelphia Cairo, MD Primary Gastroenterologist:  Dr. Jena Gauss  Pre-Procedure History & Physical: HPI:  Gloria Velez is a 52 y.o. female is here for a screening colonoscopy.   Past Medical History:  Diagnosis Date   Anxiety     Past Surgical History:  Procedure Laterality Date   CESAREAN SECTION      Prior to Admission medications   Medication Sig Start Date End Date Taking? Authorizing Provider  busPIRone (BUSPAR) 7.5 MG tablet Take 7.5 mg by mouth 2 (two) times daily.   Yes [provider]  Cholecalciferol (D3-1000 PO) Take by mouth daily.   Yes [provider]  Na Sulfate-K Sulfate-Mg Sulf 17.5-3.13-1.6 GM/177ML SOLN As directed 08/18/23   Primrose Oler, Gerrit Friends, MD  Turmeric (QC TUMERIC COMPLEX PO) Take by mouth.    [provider]    Allergies as of 08/18/2023   (No Known Allergies)    History reviewed. No pertinent family history.  Social History   Socioeconomic History   Marital status: Married    Spouse name: Not on file   Number of children: Not on file   Years of education: Not on file   Highest education level: Not on file  Occupational History   Not on file  Tobacco Use   Smoking status: Never   Smokeless tobacco: Never  Vaping Use   Vaping status: Never Used  Substance and Sexual Activity   Alcohol use: Never   Drug use: Never   Sexual activity: Not on file  Other Topics Concern   Not on file  Social History Narrative   Not on file   Social Determinants of Health   Financial Resource Strain: Not on file  Food Insecurity: Not on file  Transportation Needs: Not on file  Physical Activity: Not on file  Stress: Not on file  Social Connections: Not on file  Intimate Partner Violence: Not on file    Review of Systems: See HPI, otherwise negative ROS  Physical Exam: BP 136/74   Pulse 84   Temp 98.4 F (36.9 C) (Oral)   Resp 17   Ht 5\' 7"  (1.702 m)   Wt 81.6 kg   LMP  (Within  Weeks) Comment: pt states a week or two ago, reports a small amount of pink as her menstrual cycles  SpO2 97%   BMI 28.19 kg/m  General:   Alert,  Well-developed, well-nourished, pleasant and cooperative in NAD Neck:  Supple; no masses or thyromegaly. Lungs:  Clear throughout to auscultation.   No wheezes, crackles, or rhonchi. No acute distress. Heart:  Regular rate and rhythm; no murmurs, clicks, rubs,  or gallops. Abdomen:  Soft, nontender and nondistended. No masses, hepatosplenomegaly or hernias noted. Normal bowel sounds, without guarding, and without rebound.    Impression/Plan: Gloria Velez is now here to undergo a screening colonoscopy.  Risks, benefits, limitations, imponderables and alternatives regarding colonoscopy have been reviewed with the patient. Questions have been answered. All parties agreeable.     Notice:  This dictation was prepared with Dragon dictation along with smaller phrase technology. Any transcriptional errors that result from this process are unintentional and may not be corrected upon review.

## 2023-09-01 NOTE — Transfer of Care (Signed)
Immediate Anesthesia Transfer of Care Note  Patient: Gloria Velez  Procedure(s) Performed: COLONOSCOPY WITH PROPOFOL  Patient Location: Short Stay  Anesthesia Type:General  Level of Consciousness: awake, alert , oriented, and patient cooperative  Airway & Oxygen Therapy: Patient Spontanous Breathing  Post-op Assessment: Report given to RN, Post -op Vital signs reviewed and stable, and Patient moving all extremities X 4  Post vital signs: Reviewed and stable  Last Vitals:  Vitals Value Taken Time  BP 101/58 09/01/23 1240  Temp 36.6 C 09/01/23 1240  Pulse 82 09/01/23 1240  Resp 17 09/01/23 1240  SpO2 98 % 09/01/23 1240    Last Pain:  Vitals:   09/01/23 1240  TempSrc: Axillary  PainSc: 0-No pain      Patients Stated Pain Goal: 9 (09/01/23 1108)  Complications: No notable events documented.

## 2023-09-01 NOTE — Anesthesia Preprocedure Evaluation (Signed)
Anesthesia Evaluation  Patient identified by MRN, date of birth, ID band Patient awake    Reviewed: Allergy & Precautions, H&P , NPO status , Patient's Chart, lab work & pertinent test results, reviewed documented beta blocker date and time   Airway Mallampati: II  TM Distance: >3 FB Neck ROM: full    Dental no notable dental hx. (+) Dental Advisory Given   Pulmonary neg pulmonary ROS   Pulmonary exam normal breath sounds clear to auscultation       Cardiovascular Exercise Tolerance: Good negative cardio ROS  Rhythm:regular Rate:Normal     Neuro/Psych   Anxiety     negative neurological ROS  negative psych ROS   GI/Hepatic negative GI ROS, Neg liver ROS,,,  Endo/Other  negative endocrine ROS    Renal/GU negative Renal ROS  negative genitourinary   Musculoskeletal   Abdominal   Peds  Hematology negative hematology ROS (+)   Anesthesia Other Findings   Reproductive/Obstetrics negative OB ROS                             Anesthesia Physical Anesthesia Plan  ASA: 1  Anesthesia Plan: General   Post-op Pain Management: Minimal or no pain anticipated   Induction: Intravenous  PONV Risk Score and Plan: Propofol infusion  Airway Management Planned: Nasal Cannula and Natural Airway  Additional Equipment: None  Intra-op Plan:   Post-operative Plan:   Informed Consent: I have reviewed the patients History and Physical, chart, labs and discussed the procedure including the risks, benefits and alternatives for the proposed anesthesia with the patient or authorized representative who has indicated his/her understanding and acceptance.     Dental Advisory Given  Plan Discussed with: CRNA  Anesthesia Plan Comments:         Anesthesia Quick Evaluation

## 2023-09-01 NOTE — Discharge Instructions (Addendum)
  Colonoscopy Discharge Instructions  Read the instructions outlined below and refer to this sheet in the next few weeks. These discharge instructions provide you with general information on caring for yourself after you leave the hospital. Your doctor may also give you specific instructions. While your treatment has been planned according to the most current medical practices available, unavoidable complications occasionally occur. If you have any problems or questions after discharge, call Dr. Jena Gauss at [phone]. ACTIVITY You may resume your regular activity, but move at a slower pace for the next 24 hours.  Take frequent rest periods for the next 24 hours.  Walking will help get rid of the air and reduce the bloated feeling in your belly (abdomen).  No driving for 24 hours (because of the medicine (anesthesia) used during the test).   Do not sign any important legal documents or operate any machinery for 24 hours (because of the anesthesia used during the test).  NUTRITION Drink plenty of fluids.  You may resume your normal diet as instructed by your doctor.  Begin with a light meal and progress to your normal diet. Heavy or fried foods are harder to digest and may make you feel sick to your stomach (nauseated).  Avoid alcoholic beverages for 24 hours or as instructed.  MEDICATIONS You may resume your normal medications unless your doctor tells you otherwise.  WHAT YOU CAN EXPECT TODAY Some feelings of bloating in the abdomen.  Passage of more gas than usual.  Spotting of blood in your stool or on the toilet paper.  IF YOU HAD POLYPS REMOVED DURING THE COLONOSCOPY: No aspirin products for 7 days or as instructed.  No alcohol for 7 days or as instructed.  Eat a soft diet for the next 24 hours.  FINDING OUT THE RESULTS OF YOUR TEST Not all test results are available during your visit. If your test results are not back during the visit, make an appointment with your caregiver to find out the  results. Do not assume everything is normal if you have not heard from your caregiver or the medical facility. It is important for you to follow up on all of your test results.  SEEK IMMEDIATE MEDICAL ATTENTION IF: You have more than a spotting of blood in your stool.  Your belly is swollen (abdominal distention).  You are nauseated or vomiting.  You have a temperature over 101.  You have abdominal pain or discomfort that is severe or gets worse throughout the day.       Your colon was normal today.  It is recommended you return in 10 years for repeat screening examination.    At patient request, I called [name] at [phone]  -   rolled to voicemail.  Left a message.

## 2023-09-08 ENCOUNTER — Encounter (HOSPITAL_COMMUNITY): Payer: Self-pay | Admitting: Internal Medicine

## 2024-01-14 ENCOUNTER — Telehealth: Admitting: Physician Assistant

## 2024-01-14 DIAGNOSIS — R6889 Other general symptoms and signs: Secondary | ICD-10-CM | POA: Diagnosis not present

## 2024-01-14 MED ORDER — OSELTAMIVIR PHOSPHATE 75 MG PO CAPS: 75.0000 mg | ORAL_CAPSULE | Freq: Two times a day (BID) | ORAL | 0 refills | Status: AC

## 2024-01-14 NOTE — Progress Notes (Signed)
 I have spent 5 minutes in review of e-visit questionnaire, review and updating patient chart, medical decision making and response to patient.   Piedad Climes, PA-C

## 2024-01-14 NOTE — Progress Notes (Signed)
